# Patient Record
Sex: Female | Born: 2000 | Race: Black or African American | Hispanic: No | Marital: Single | Smoking: Never smoker
Health system: Southern US, Community
[De-identification: ages and names within clinical notes are randomized; demographics above are authoritative.]

---
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
## 2020-09-28 ENCOUNTER — Ambulatory Visit (INDEPENDENT_AMBULATORY_CARE_PROVIDER_SITE_OTHER): Payer: 59

## 2020-09-28 VITALS — BP 127/75 | HR 87 | Ht 61.5 in | Wt 118.6 lb

## 2020-09-28 DIAGNOSIS — Z3201 Encounter for pregnancy test, result positive: Secondary | ICD-10-CM | POA: Diagnosis not present

## 2020-09-28 LAB — POCT PREGNANCY, URINE: Preg Test, Ur: POSITIVE — AB

## 2020-09-28 MED ORDER — PREPLUS 27-1 MG PO TABS: 1.0000 | ORAL_TABLET | Freq: Every day | ORAL | 8 refills | Status: AC

## 2020-09-28 NOTE — Progress Notes (Signed)
LMP 07/01/20- unsure of dates EDD: 04/07/21 12w 5d  Pt here today for UPT, +UPT at home and in office today. Pt denies any vaginal bleeding, clots, pain/cramps.  Pt advised to have dating ultrasound due to unsure of dates due to irregular periods.   Pt has [place] for dating scheduled for 10/05/20 at 1115 am at [facility].   Dr Crissie Reese aware of new OB pt.   Judeth Cornfield, RN  09/28/20.

## 2020-09-28 NOTE — Patient Instructions (Addendum)
Prenatal Care Providers           [facility]  [address] [phone]  [facility]   [address]  [phone]  [facility] @ [place]       [address] [phone]            [facility] @ [place]     [address] [phone]          [facility] [place]   [address] [phone]  [facility]  [address] [phone]     [facility] ([place])  [address]   [phone]     [facility]  Phone: [phone]   

## 2020-09-28 NOTE — Progress Notes (Signed)
Chart reviewed for nurse visit. Agree with plan of care.   Zaxton Angerer M, MD 09/28/20 12:13 PM 

## 2020-10-04 ENCOUNTER — Telehealth (INDEPENDENT_AMBULATORY_CARE_PROVIDER_SITE_OTHER): Payer: 59

## 2020-10-04 DIAGNOSIS — O219 Vomiting of pregnancy, unspecified: Secondary | ICD-10-CM

## 2020-10-04 DIAGNOSIS — Z3400 Encounter for supervision of normal first pregnancy, unspecified trimester: Secondary | ICD-10-CM | POA: Insufficient documentation

## 2020-10-04 DIAGNOSIS — Z3A00 Weeks of gestation of pregnancy not specified: Secondary | ICD-10-CM

## 2020-10-04 MED ORDER — PROMETHAZINE HCL 25 MG PO TABS: 25.0000 mg | ORAL_TABLET | Freq: Four times a day (QID) | ORAL | 0 refills | Status: AC | PRN

## 2020-10-04 NOTE — Progress Notes (Addendum)
New OB Intake  I connected with  Donna Coffey on 10/04/20 at 10:15 AM EDT by telepone and verified that I am speaking with the correct person using two identifiers. Nurse is located at [facility] and pt is located at home. Approx. 38 minutes were spent with patient via telephone.  I discussed the limitations, risks, security and privacy concerns of performing an evaluation and management service by telephone and the availability of in person appointments. I also discussed with the patient that there may be a patient responsible charge related to this service. The patient expressed understanding and agreed to proceed.  I explained I am completing New OB Intake today. We discussed her EDD of 04/07/21 that is based on LMP of 07/01/20. LMP is accurate within days; pt will have dating [place] tomorrow to confirm. Pt is G1/P0. I reviewed her allergies, medications, Medical/Surgical/OB history, and appropriate screenings. I informed her of [facility] services. Based on history, this is an uncomplicated pregnancy.  Patient Active Problem List   Diagnosis Date Noted  . Supervision of low-risk first pregnancy 10/04/2020   Concerns addressed today Pt complains of ongoing nausea with one episode of vomiting. Phenergan prescribed per protocol.  Delivery Plans Plans to deliver at [facility].   MyChart/Babyscripts MyChart access verified. I explained pt will have some visits in office and some virtually. Babyscripts instructions given and order placed. Patient verifies receipt of registration text/e-mail. Account successfully created and app downloaded.  Blood Pressure Cuff Patient has private insurance; instructed to purchase blood pressure cuff and bring to first prenatal appt. Explained after first prenatal appt pt will check weekly and document in Babyscripts.  Anatomy US Explained first scheduled [place] will be around 19 weeks. Order placed. Pt to be scheduled at new OB appt once dating is confirmed by [place].    Labs Discussed Avelina Laine genetic screening with patient. Would like both Panorama and Horizon drawn at new OB visit. Routine prenatal labs needed.  Covid Vaccine Patient has not covid vaccine.   Social Determinants of Health . Food Insecurity: Patient denies food insecurity. . WIC Referral: Patient is considering. . Transportation: Patient denies transportation needs. . Childcare: Discussed no children allowed at ultrasound appointments. Offered childcare services; patient declines childcare services at this time.  First visit review I reviewed new OB appt with pt. I explained she will have a visit with provider that includes a physical exam and ob bloodwork with genetic screening. Explained pt will be seen by Nolene Bernheim, NP at first visit; encounter routed to appropriate provider. Explained that patient will be seen by pregnancy navigator following visit with provider.  Marjo Bicker, RN 10/04/2020  11:01 AM    Chart reviewed for nurse visit. Agree with plan of care.   Currie Paris, NP 10/04/2020 3:20 PM

## 2020-10-05 ENCOUNTER — Ambulatory Visit (HOSPITAL_COMMUNITY)
Admission: RE | Admit: 2020-10-05 | Discharge: 2020-10-05 | Disposition: A | Discharge status: Home / Self Care | Payer: 59 | Source: Ambulatory Visit | Attending: Obstetrics and Gynecology | Admitting: Obstetrics and Gynecology

## 2020-10-05 ENCOUNTER — Other Ambulatory Visit: Payer: Self-pay

## 2020-10-05 DIAGNOSIS — Z3201 Encounter for pregnancy test, result positive: Secondary | ICD-10-CM | POA: Diagnosis present

## 2020-10-12 ENCOUNTER — Ambulatory Visit (INDEPENDENT_AMBULATORY_CARE_PROVIDER_SITE_OTHER): Payer: 59 | Admitting: Nurse Practitioner

## 2020-10-12 ENCOUNTER — Other Ambulatory Visit: Payer: Self-pay

## 2020-10-12 ENCOUNTER — Other Ambulatory Visit (HOSPITAL_COMMUNITY)
Admission: RE | Admit: 2020-10-12 | Discharge: 2020-10-12 | Disposition: A | Discharge status: Home / Self Care | Payer: 59 | Source: Ambulatory Visit | Attending: Nurse Practitioner | Admitting: Nurse Practitioner

## 2020-10-12 ENCOUNTER — Encounter: Payer: Self-pay | Admitting: Nurse Practitioner

## 2020-10-12 VITALS — BP 125/71 | HR 99 | Wt 125.0 lb

## 2020-10-12 DIAGNOSIS — Z3401 Encounter for supervision of normal first pregnancy, first trimester: Secondary | ICD-10-CM

## 2020-10-12 NOTE — Patient Instructions (Signed)
[email or web address]   Safe Medications in Pregnancy   Acne: Benzoyl Peroxide Salicylic Acid  Backache/Headache: Tylenol: 2 regular strength every 4 hours OR              2 Extra strength every 6 hours  Colds/Coughs/Allergies: Benadryl (alcohol free) 25 mg every 6 hours as needed Breath right strips Claritin Cepacol throat lozenges Chloraseptic throat spray Cold-Eeze- up to three times per day Cough drops, alcohol free Flonase (by prescription only) Guaifenesin Mucinex Robitussin DM (plain only, alcohol free) Saline nasal spray/drops Sudafed (pseudoephedrine) & Actifed ** use only after [redacted] weeks gestation and if you do not have high blood pressure Tylenol Vicks Vaporub Zinc lozenges Zyrtec   Constipation: Colace Ducolax suppositories Fleet enema Glycerin suppositories Metamucil Milk of magnesia Miralax Senokot Smooth move tea  Diarrhea: Kaopectate Imodium A-D  *NO pepto Bismol  Hemorrhoids: Anusol Anusol HC Preparation H Tucks  Indigestion: Tums Maalox Mylanta Zantac  Pepcid  Insomnia: Benadryl (alcohol free) 25mg  every 6 hours as needed Tylenol PM Unisom, no Gelcaps  Leg Cramps: Tums MagGel  Nausea/Vomiting:  Bonine Dramamine Emetrol Ginger extract Sea bands Meclizine  Nausea medication to take during pregnancy:  Unisom (doxylamine succinate 25 mg tablets) Take one tablet daily at bedtime. If symptoms are not adequately controlled, the dose can be increased to a maximum recommended dose of two tablets daily (1/2 tablet in the morning, 1/2 tablet mid-afternoon and one at bedtime). Vitamin B6 100mg  tablets. Take one tablet twice a day (up to 200 mg per day).  Skin Rashes: Aveeno products Benadryl cream or 25mg  every 6 hours as needed Calamine Lotion 1% cortisone cream  Yeast infection: Gyne-lotrimin 7 Monistat 7   **If taking multiple medications, please check labels to avoid duplicating the same active ingredients **take  medication as directed on the label ** Do not exceed 4000 mg of tylenol in 24 hours **Do not take medications that contain aspirin or ibuprofen   AREA PEDIATRIC/FAMILY PRACTICE PHYSICIANS  [place]outheast [place] ( ) . [facility] [place]; [id number], MD; [facility], MD; Melodie Bouillon, MD; McDiarmid, MD; Lum Babe, MD; Sheffield Slider, MD; Leveda Anna, MD o [address] [place], [address] [place] o [phone] o Mon-Fri 8:30-12:30, 1:30-5:00 o Providers come to see babies at [facility] o Accepting Medicaid . [facility] [place] o Limited providers who accept newborns: [phone], MD; [facility], MD; [place], MD o [address], [place], Paulino Rily [address] o [phone] o Mon-Fri 8:00-5:30 o Babies seen by providers at [facility] o Does NOT accept Medicaid o Please call early in hospitalization for appointment (limited availability)  . [facility] [phone], MD o [address], [place], Fatima Sanger [address] o [phone] o Mon, Tue, Thur, Fri 8:30-5:00, Wed 10:00-7:00 (closed 1-2pm) o Babies seen by [facility] providers o Accepting Medicaid . 12-27-1968 - Pediatrician 05-16-1998, MD o [address], [place], Fae Pippin [place] o [phone] o Mon-Fri 8:30-5:00, Sat 8:30-12:00 o Provider comes to see babies at [facility] o Accepting Medicaid o Must have been referred from current patients or contacted office prior to delivery . Tim & [phone] [facility]) [facility], MD; Kingsley Plan, MD; [facility], MD; Leotis Pain, MD; Ave Filter, MD; Luna Fuse, MD; Kennedy Bucker, MD; Konrad Dolores, MD; Kathlene November, MD; Jenne Campus, MD; Lubertha South, NP; Wynetta Emery, NP o [address] [place], [place], [address] [place] o [phone] o Mon, Tue, Thur, Fri 8:30-5:30, Wed 9:30-5:30, Sat 8:30-12:30 o Babies seen  by [facility] providers o Accepting Medicaid o Only accepting infants of  first-time parents or siblings of current patients o Hospital discharge coordinator will make follow-up appointment . Cyril MourningJack Amos o [address], [place], [place]  [address] o [phone]   Fax - [phone] . [facility] o [address], [place], [place]  [address] o Phone - [phone]   Fax - [phone] . Lucio EdwardShilpa Gosrani o [address], [place], [place]  [address] o [phone]  East/[place]heast [place] [phone]) . [facility] Jorge Mandrilo Bates, MD; Alita ChyleBrassfield, MD; Princella Ionooper, Cox, MD; MD; Earlene Plateravis, MD; Jamesetta Orleansovico, MD; Alvera NovelEttefaugh, MD; Clarene DukeLittle, MD; Rana SnareLowe, MD; Carmon GinsbergKeiffer, MD; Alinda MoneyMelvin, MD; Hosie PoissonSumner, MD; Mayford KnifeWilliams, MD o [address], [place], [place] [address] o [phone] o Mon-Fri 8:30-5:00 (extended evenings Mon-Thur as needed), Sat-Sun 10:00-1:00 o Providers come to see babies at [facility] o Accepting Medicaid for families of first-time babies and families with all children in the household age 723 and under. Must register with office prior to making appointment (M-F only). Alric Quan. [facility] Odella Aquaso Henson, NP; Lynelle DoctorKnapp, MD; Susann GivensLalonde, MD; [place], [place] o [address] [place], [place] [address] o [phone] o Mon-Fri 8:00-5:00 o Babies seen by providers at [facility] o Does NOT accept Medicaid/Commercial Insurance Only . [facility] [place] [facility])  Suzette Battiesto Artis, MD; Zachery DauerBarnes, MD; Stefan ChurchBratton, MD; Sabino Dickoccaro, MD; Quitman LivingsLockett Gardner, MD; Farris HasKramer, MD; Gaynell FaceMarshall, MD; Betha LoaNetherton, MD; Colon FlatteryPoleto, MD; Clifton JamesSkinner, MD o [address] [place], [place] [address] o [phone] o Mon-Fri 8:30-5:30, Sat (Oct.-Mar.) 9:00-1:00 o Babies seen by providers at [facility] o Accepting [facility]  [place] [phone]) . [facility] [place] [place] o Reid, MD; Sheliah HatchWarner, MD o [address], [place], [place] [address] o [phone] o Mon-Fri 8:30-5:00, Sat 8:30-12:00 o Providers come to see babies at [facility] o Does NOT accept Medicaid . [facility] Cindy Hazyo Becker, [place]; FreerHagler, MD; [place], [place]; Wynelle LinkSun, MD; Azucena CecilSwayne, MD o [address], [place], [place] [address] o [phone] o Mon-Fri 8:00-5:00 o Babies seen by providers at [facility] o Does NOT accept Medicaid o Only accepting babies of parents who are patients o Please call early in hospitalization for appointment (limited availability) . [facility] Lamar Beneso Clark, MD; Abran CantorFrye, MD; Early OsmondKelleher, MD; Cherre HugerMack, NP; Hyacinth MeekerMiller, MD; Dwan Bolt'Keller, MD; Jarold MottoPatterson, NP; Dario GuardianPudlo, MD; Talmage NapPuzio, MD; Maisie Fushomas, MD; Pricilla Holmucker, MD; Tama Highwiselton, MD o [address], [place], [place] [address] o [phone] o Mon-Fri 8:00-5:00, Sat 9:00-12:00 o Providers come to see babies at [facility] o Does NOT accept [facility]  [place] [phone]) . [facility] o Limited providers accepting new patients: Drema PryBrake, NP; [place], PA o [address], [place], [place] [address] o [phone] o Mon-Fri 8:00-5:00 o Babies seen by providers at [facility] o Does NOT accept Medicaid o Only accepting babies of parents who are patients o Please call early in hospitalization for appointment (limited availability) . [facility] Luan Pullingo Gay, MD; Nash DimmerQuinlan, MD o [address] [place], [place] [address] o [phone] (press 1 to schedule appointment) o Mon-Fri 8:00-5:00 o Providers come to see babies at [facility] o Does NOT accept Medicaid . [facility] Cristino Marteso Mazer, MD o [address] [place], [place] [address] o [phone] o Mon-Fri 8:30-5:00 (lunch 12:30-1:00), extended hours by appointment only Wed 5:00-6:30 o Babies seen by [facility] providers o Accepting Medicaid . [facility] o Banks,  MD; [place], MD; Hassan Rowan, MD o [address] [place], [place], [place] [address] o [phone] o Mon-Fri 8:00-5:00 o Babies seen by [facility] providers o Does  NOT accept Medicaid . Nature conservation officer at [place] [address] [place] Soho, MD; Durene Cal, MD; Hurst, DO o [address] [place], [place] [address] o [phone] o Mon-Fri 8:00-5:00 o Babies seen by [facility] providers o Does NOT accept Medicaid . [facility] o [place], [place]; [place], [place]; [place], NP; Avis Epley, MD; Vonna Kotyk, MD; Clance Boll, MD; Stevphen Rochester, NP; Arvilla Market, NP; Ann Maki, NP; Otis Dials, NP; Vaughan Basta, MD; [place], MD o [address] [place], [place] [address] o [phone] o Mon-Fri 8:30-5:00, Sat 10:00-1:00 o Providers come to see babies at [facility] o Does NOT accept Medicaid o Free prenatal information session Tuesdays at 4:45pm . [facility] Luna Kitchens, MD; [place], [place]; [place], [place]; [place], [place] o [address] [place] [address] o [phone] o Mon-Fri 7:30-5:30 o Babies seen by [facility] providers . [facility] o [address], [place], [place]  [address] o [phone]   Fax - [phone]  [place] [phone] & [phone]) . [facility] Alphonsa Overall, MD o [address] [place], [place] [address] o [phone] o Mon-Thur 8:00-6:00 o Providers come to see babies at [facility] o Accepting Medicaid . [facility] Zenon Mayo, NP; Cyndia Bent, MD; [place], [place]; [place], [place] o [address] [place], [place] [address] o [phone] o Mon-Thur 7:30-7:30, Fri 7:30-4:30 o Babies seen by [facility] providers o Accepting Medicaid . [facility] Cheryle Horsfall, MD; Janene Harvey, NP; Vonita Moss, MD o [address], [place], [place] [address] o [phone] o Mon-Fri 8:30-5:00, Sat 8:30-12:00 o Providers come to see babies at [facility] o Accepting Medicaid o Must have "Meet & Greet" appointment at office prior to delivery . [facility] [place] [facility] Llana Aliment, MD; Earlene Plater, MD; Lucretia Roers, MD o [address], [place], [place] [address] o [phone] o Mon-Wed 8:00-6:00, Thur-Fri 8:00-5:00, Sat 9:00-12:00 o Providers come to see babies at [facility] o Does NOT accept Medicaid o Only accepting siblings of current patients . [facility]  o [address], [place], [place]  [address] o [phone]   Fax - [phone] . [facility] at [facility] o [phone] [address], [place], [place]  [address] o [phone]   Fax - [phone]  [place] [phone] & [phone]) . Nature conservation officer at [facility] o [place], DO; [place], DO o [address] [place], [place] [address] o [phone] o Mon-Fri 7:00-5:00 o Babies seen by [facility] providers o Does NOT accept Medicaid . [facility] Ellis Savage, MD; [place], [place]; [place], [place] o [address], [place], [place] [address] o [phone] o Mon-Fri 8:00-5:00 o Babies seen by [facility] providers o Accepting Medicaid . [facility] [place] [facility] - [address] Franne Forts, MD; [place], [place]; [place], NP; [place], [place] o [address] [place], [place], [place] [address] o [phone] o Mon-Fri 8:00-5:00 o Babies seen by providers at [facility] o Accepting [facility] [place] [phone]) . [facility] [place], [place] o [address] [place], [place] [address] o [phone] o Mon-Fri 8:00-5:00 o Babies seen by [facility] providers o Does NOT accept Medicaid o Limited availability, please call early in hospitalization to  schedule follow-up . [facility] Jolee Ewing, PA; Eddie Candle, MD; [place] Cavanaugh, MD; [place], [place]; Constance Goltz, MD; [place], [place] o [facility] [address], [place], [place] [address] o [phone] o Mon-Fri 8:30-5:00, Sat 9:00-12:00 o Babies seen by providers at [facility] o Accepting Medicaid o Please register online  then schedule online or call office o [email or web address] . [facility]) Samuella Bruin, NP; Lucianne Muss, MD; Lanier Clam, PA o [address], [place], [place] [address] o [phone] o Mon-Fri 8:00-5:00 o Babies seen by providers at [facility] o Accepting Medicaid . [facility]) Sharin Mons, MD; Reed Breech, NP; Shelva Majestic, MD o [address], [place], [place] [address] o [phone] o Mon-Fri 8:00-5:30, Sat&Sun by appointment (phones open at 8:30) o Babies seen by [facility] providers o Accepting Medicaid o Must be a first-time baby or sibling of current patient . [facility] [address], [place], [place]  [address] o [phone]   Fax - [phone]  [place] [phone] & [phone]) . [facility] o [place], [place]; [place], [place]; Dimple Casey, MD; [place], [place]; Carolyne Fiscal, MD o [address], [place], [place] [address] o [phone] o Mon-Thur 8:00-7:00, Fri 8:00-5:00, Sat 8:00-12:00, Sun 9:00-12:00 o Babies seen by [facility] providers o Accepting Medicaid . [facility] at [facility], MD; Gaynell Face, MD; [facility], MD o [address], [place], [place] [address] o [phone] o Mon-Thur 8:00-5:00 o Babies seen by providers at [facility] o Accepting Medicaid . [facility] at [place] Gwenlyn Saran, MD; Coe[place]Goins, MD; Madilyn Fireman, MD; Melvyn Neth, MD; List, MD; Lazarus Salines, MD; Gaynell Face, MD; Berneda Rose, MD; Flora Lipps, MD; Beryl Meager, MD; Luther Redo, MD; Lavonia Drafts, MD; Kellie Simmering, MD o [address] [place], [place] [address] o [phone] o Mon-Fri 8:00-5:30, Sat (Oct.-Mar.) 9:00-1:00 o Babies seen by providers at [facility] o Accepting Medicaid o Must fill out new patient packet, available online at  [email or web address] . [facility] at [facility]) Simone Curia, NP; Tiburcio Pea, NP; Tresa Endo, NP; Whitney Post, MD; [place], [place]; Hennie Duos, MD; Wynne Dust, MD; Kavin Leech, NP o [address], [place], [place] [address] o [phone] o Mon-Thur 8:00-5:30, Fri 8:00-5:00 o Babies seen by providers at [facility] o Accepting [facility] [phone]) . [facility] o [place], [place]; [place], MD; Tanya Nones, MD; [place], [place] o [address] [place], [place] [address] o [phone] o Mon-Fri 8:00-5:00 o Babies seen by providers at [facility] o Accepting [facility] [phone]) . [facility] at [facility] o [place], DO; Lenise Arena, MD; [place], [place] o [address], [place], [place] [address] o [phone] o Mon-Fri 8:00-5:00 o Babies seen by providers at [facility] o Does NOT accept Medicaid o Limited appointment availability, please call early in hospitalization  . Nature conservation officer at [facility] o [place], DO; [place], MD o [address] [place], [place] [address] o [phone] o Mon-Fri 8:00-5:00 o Babies seen by [facility] providers o Does NOT accept Medicaid . [facility] Lorrine Kin, MD; Ninetta Lights, MD; [place], [place]; [place], MD o [address], [place], [place] [address] o [phone] o Mon-Fri 8:00-5:00 o After hours clinic ([address] [place], [place] [address]) [phone] Mon-Fri 5:00-8:00, Sat 12:00-6:00, Sun 10:00-4:00 o Babies seen by [facility] providers o Accepting Medicaid . [facility] at [facility] o [address], [place], [place]  [address] o [phone]   Fax - [phone]  [place] [phone]) . Nature conservation officer at [facility], MD o [address] [place], [place], [place] [address] o [phone] o Mon-Fri 8:00-5:00 o Babies seen by [facility] providers o Does NOT accept  Medicaid . [facility] - [place] [facility] at [place]) Tomi Likens, MD o [address] [place] [address] [place], [place] [address] o [phone] o Mon-Thur 8:00-7:00, Fri 8:00-5:00, Sat 8:00-12:00 o Babies seen by providers at [facility] o Accepting Medicaid - but does not have vaccinations in office (must be received elsewhere) o Limited availability, please call early in hospitalization  [place] ([phone]) . [facility]  o Wyvonne Lenz, MD o [address] [place] [address] o [phone]  Fax [phone]

## 2020-10-12 NOTE — Progress Notes (Signed)
Subjective:   Donna Coffey is a 20 y.o. G1P0000 at [redacted]w[redacted]d by early ultrasound being seen today for her first obstetrical visit.  Her obstetrical history is significant for first pregnancy with some nausea. Patient does intend to breast feed. Pregnancy history fully reviewed.  Patient reports no complaints.  HISTORY: OB History  Gravida Para Term Preterm AB Living  1 0 0 0 0 0  SAB IAB Ectopic Multiple Live Births  0 0 0 0 0    # Outcome Date GA Lbr Len/2nd Weight Sex Delivery Anes PTL Lv  1 Current            History reviewed. No pertinent past medical history. History reviewed. No pertinent surgical history. Family History  Problem Relation Age of Onset  . Cancer Maternal Grandmother    Social History   Tobacco Use  . Smoking status: Never Smoker  . Smokeless tobacco: Never Used  Vaping Use  . Vaping Use: Never used  Substance Use Topics  . Alcohol use: Not Currently    Comment: special occasions  . Drug use: Never   No Known Allergies Current Outpatient Medications on File Prior to Visit  Medication Sig Dispense Refill  . Prenatal Vit-Fe Fumarate-FA (PREPLUS) 27-1 MG TABS Take 1 tablet by mouth daily. 30 tablet 8  . promethazine (PHENERGAN) 25 MG tablet Take 1 tablet (25 mg total) by mouth every 6 (six) hours as needed for nausea or vomiting. 30 tablet 0   No current facility-administered medications on file prior to visit.     Exam   Vitals:   10/12/20 1543  BP: 125/71  Pulse: 99  Weight: 125 lb (56.7 kg)   Fetal Heart Rate (bpm): 172  Uterus:     Pelvic Exam: Perineum: Pelvic deferred - pap not due   Vulva:    Vagina:     Cervix:    Adnexa:    Bony Pelvis:   System: General: well-developed, well-nourished female in no acute distress   Breast:  normal appearance, no masses or tenderness   Skin: normal coloration and turgor, no rashes   Neurologic: oriented, normal, negative, normal mood   Extremities: normal strength, tone, and muscle mass, ROM  of all joints is normal   HEENT extraocular movement intact and sclera clear, anicteric   Mouth/Teeth deferred   Neck supple and no masses, normal thyroid   Cardiovascular: regular rate and rhythm   Respiratory:  no respiratory distress, normal breath sounds   Abdomen: soft, non-tender; no masses,  no organomegaly     Assessment:   Pregnancy: G1P0000 Patient Active Problem List   Diagnosis Date Noted  . Supervision of low-risk first pregnancy 10/04/2020     Plan:  1. Encounter for supervision of low-risk first pregnancy in first trimester Doing well.  No vomiting.  Does not want medication for nausea as it is resolving Will get genetic testing next visit. Unable to hear FHT with doppler today.  Handheld [place] by Dr. Crissie Reese. Thought her sister had a BP cuff so she did not purchase one, but then her sister could not find the BP cuff.  Recommended she purchase her own.  - CBC/D/Plt+RPR+Rh+ABO+RubIgG... - Culture, OB Urine - Hemoglobin A1c - GC/Chlamydia probe amp ([place])not at [facility]   Initial labs drawn. Continue prenatal vitamins. Genetic Screening discussed, NIPS: discussed and will draw at next visit due to change in [facility] based on early [place]. Ultrasound discussed; fetal anatomic survey: will order at next visit. Problem  list reviewed and updated. The nature of [place] [facility] with multiple MDs and other Advanced Practice Providers was explained to patient; also emphasized that residents, students are part of our team. Routine obstetric precautions reviewed. Return in about 4 weeks (around 11/09/2020) for in person ROB.  Total face-to-face time with patient: 40 minutes.  Over 50% of encounter was spent on counseling and coordination of care.     Nolene Bernheim, FNP Family Nurse Practitioner, [facility] for [employer], [facility] 10/12/2020 5:35 PM

## 2020-10-13 LAB — GC/CHLAMYDIA PROBE AMP (~~LOC~~) NOT AT ARMC
Chlamydia: NEGATIVE
Comment: NEGATIVE
Comment: NORMAL
Neisseria Gonorrhea: NEGATIVE

## 2020-10-13 LAB — CBC/D/PLT+RPR+RH+ABO+RUBIGG...
Antibody Screen: NEGATIVE
Basophils Absolute: 0 10*3/uL (ref 0.0–0.2)
Basos: 0 %
EOS (ABSOLUTE): 0.1 10*3/uL (ref 0.0–0.4)
Eos: 1 %
HCV Ab: <0.1 s/co ratio (ref 0.0–0.9)
HIV Screen 4th Generation wRfx: NONREACTIVE
Hematocrit: 37.4 % (ref 34.0–46.6)
Hemoglobin: 12 g/dL (ref 11.1–15.9)
Hepatitis B Surface Ag: NEGATIVE
Immature Grans (Abs): 0 10*3/uL (ref 0.0–0.1)
Immature Granulocytes: 0 %
Lymphocytes Absolute: 2.2 10*3/uL (ref 0.7–3.1)
Lymphs: 21 %
MCH: 26.1 pg — ABNORMAL LOW (ref 26.6–33.0)
MCHC: 32.1 g/dL (ref 31.5–35.7)
MCV: 81 fL (ref 79–97)
Monocytes Absolute: 0.9 10*3/uL (ref 0.1–0.9)
Monocytes: 9 %
Neutrophils Absolute: 7.4 10*3/uL — ABNORMAL HIGH (ref 1.4–7.0)
Neutrophils: 69 %
Platelets: 213 10*3/uL (ref 150–450)
RBC: 4.6 x10E6/uL (ref 3.77–5.28)
RDW: 14.6 % (ref 11.7–15.4)
RPR Ser Ql: NONREACTIVE
Rh Factor: POSITIVE
Rubella Antibodies, IGG: 3.64 index (ref >0.99)
WBC: 10.6 10*3/uL (ref 3.4–10.8)

## 2020-10-13 LAB — HCV INTERPRETATION

## 2020-10-13 LAB — HEMOGLOBIN A1C
Est. average glucose Bld gHb Est-mCnc: 114 mg/dL
Hgb A1c MFr Bld: 5.6 % (ref 4.8–5.6)

## 2020-10-14 LAB — URINE CULTURE, OB REFLEX

## 2020-10-14 LAB — CULTURE, OB URINE

## 2020-11-12 ENCOUNTER — Encounter: Payer: 59 | Admitting: Medical

## 2020-12-09 ENCOUNTER — Encounter: Payer: 59 | Admitting: Obstetrics and Gynecology

## 2020-12-27 ENCOUNTER — Encounter: Payer: Self-pay | Admitting: Family Medicine

## 2020-12-27 ENCOUNTER — Ambulatory Visit (INDEPENDENT_AMBULATORY_CARE_PROVIDER_SITE_OTHER): Payer: 59 | Admitting: Family Medicine

## 2020-12-27 ENCOUNTER — Other Ambulatory Visit: Payer: Self-pay

## 2020-12-27 VITALS — BP 119/73 | HR 85 | Wt 130.3 lb

## 2020-12-27 DIAGNOSIS — Z3A20 20 weeks gestation of pregnancy: Secondary | ICD-10-CM

## 2020-12-27 DIAGNOSIS — N939 Abnormal uterine and vaginal bleeding, unspecified: Secondary | ICD-10-CM

## 2020-12-27 DIAGNOSIS — Z3400 Encounter for supervision of normal first pregnancy, unspecified trimester: Secondary | ICD-10-CM

## 2020-12-27 NOTE — Progress Notes (Addendum)
Subjective:  Donna Coffey is a 20 y.o. G1P0000 at [redacted]w[redacted]d being seen today for prenatal care. Last seen for initial prenatal visit. Overall, doing well.  Patient reports mild vaginal spotting occasionally after intercourse. Describes as "one or two drops." No associated vaginal discharge, abdominal pain, vaginal irritation, or itching.  Contractions: Not present. Movement: Present. Denies leaking of fluid.   The following portions of the patient's history were reviewed and updated as appropriate: allergies, current medications, past family history, past medical history, past social history, past surgical history and problem list.   Objective:   Vitals:   12/27/20 1547  BP: 119/73  Pulse: 85  Weight: 130 lb 4.8 oz (59.1 kg)    Fetal Status: Fetal Heart Rate (bpm): 150 Fundal Height: 21 cm Movement: Present     General:  Alert, oriented and cooperative. Patient is in no acute distress.  Skin: Skin is warm and dry. No rash noted.   Cardiovascular: Normal heart rate noted  Respiratory: Normal respiratory effort, no problems with respiration noted  Abdomen: Soft, gravid, appropriate for gestational age. Pain/Pressure: Absent     Vaginal: Vag. Bleeding: None.       Cervix: Not evaluated  Declined pelvic exam.       Extremities: Normal range of motion.  Edema: None  Mental Status: Normal mood and affect. Normal behavior. Normal judgment and thought content.    Assessment and Plan:  Pregnancy: G1P0000 at [redacted]w[redacted]d doing well   1. Encounter for supervision of low-risk first pregnancy, antepartum Declined Horizon Screening  Anatomy US scheduled for 8/24 Continue taking prenatal vitamin  - Genetic Screening (panorama screening)  - AFP, Serum, Open Spina Bifida  2. [redacted] weeks gestation of pregnancy  3. Vaginal spotting Post-coital only and occasionally, 1-2 drops per patient report, likely friable cervix. However, patient declined STI/wet prep swabs and pelvic exam today for further evaluation  despite recommendation. +FHT and normal fetal movement with anatomy U/S in the next few weeks.   She was given precautions to go to the MAU/return if the bleeding increases, LOF occurs, or if severe abdominal pain or contractions occur.   Preterm labor symptoms and general obstetric precautions including but not limited to vaginal bleeding, contractions, leaking of fluid and fetal movement were reviewed in detail with the patient.  Please refer to After Visit Summary for other counseling recommendations.  Return in about 4 weeks (around 01/24/2021) for LROB.   Athena Masse, Student-PA   GME ATTESTATION:  I saw and evaluated the patient. I agree with the findings and the plan of care as documented in the Student's note.  Leticia Penna, DO OB Fellow, [facility], [facility] 12/27/2020 4:55 PM

## 2020-12-27 NOTE — Patient Instructions (Signed)
Recommend that you be evaluated if you have any worsening of your vaginal bleeding.   Additionally, if you have any leakage of fluid or persistent contractions please go to the hospital:   [facility]  [address]  [place], [place]

## 2020-12-29 LAB — AFP, SERUM, OPEN SPINA BIFIDA
AFP MoM: 1.4
AFP Value: 107.9 ng/mL
Gest. Age on Collection Date: 20.5 weeks
Maternal Age At EDD: 20.8 yr
OSBR Risk 1 IN: 7201
Test Results:: NEGATIVE
Weight: 130 [lb_av]

## 2021-01-12 ENCOUNTER — Encounter: Payer: Self-pay | Admitting: General Practice

## 2021-01-19 ENCOUNTER — Ambulatory Visit: Payer: 59 | Attending: Nurse Practitioner

## 2021-01-19 ENCOUNTER — Other Ambulatory Visit: Payer: Self-pay

## 2021-01-19 ENCOUNTER — Encounter: Payer: Self-pay | Admitting: Nurse Practitioner

## 2021-01-19 ENCOUNTER — Other Ambulatory Visit: Payer: Self-pay | Admitting: Nurse Practitioner

## 2021-01-19 DIAGNOSIS — O43199 Other malformation of placenta, unspecified trimester: Secondary | ICD-10-CM | POA: Insufficient documentation

## 2021-01-19 DIAGNOSIS — Z3400 Encounter for supervision of normal first pregnancy, unspecified trimester: Secondary | ICD-10-CM | POA: Diagnosis not present

## 2021-01-21 ENCOUNTER — Other Ambulatory Visit: Payer: Self-pay | Admitting: *Deleted

## 2021-01-21 DIAGNOSIS — O43199 Other malformation of placenta, unspecified trimester: Secondary | ICD-10-CM

## 2021-01-21 DIAGNOSIS — O283 Abnormal ultrasonic finding on antenatal screening of mother: Secondary | ICD-10-CM

## 2021-01-27 ENCOUNTER — Encounter: Payer: 59 | Admitting: Obstetrics and Gynecology

## 2021-02-16 ENCOUNTER — Ambulatory Visit: Payer: 59

## 2021-03-02 ENCOUNTER — Ambulatory Visit: Payer: 59

## 2021-03-02 ENCOUNTER — Ambulatory Visit: Payer: 59 | Attending: Obstetrics

## 2021-05-13 ENCOUNTER — Inpatient Hospital Stay (HOSPITAL_COMMUNITY): Admission: RE | Admit: 2021-05-13 | Payer: 59 | Source: Home / Self Care | Admitting: Obstetrics

## 2023-02-10 IMAGING — US US OB < 14 WEEKS - US OB TV
1 series · 14 of 28 positions shown · non-contrast
Comparison: None.

CLINICAL DATA: Dating uncertain LMP

EXAM:
OBSTETRIC <14 WK US AND TRANSVAGINAL OB US
TECHNIQUE: Both transabdominal and transvaginal ultrasound examinations were
performed for complete evaluation of the gestation as well as the
maternal uterus, adnexal regions, and pelvic cul-de-sac.
Transvaginal technique was performed to assess early pregnancy.

[Series 1: us ob less than 14 weeks with ob transvaginal · 14 of 107 slices shown]
[im 4/107]
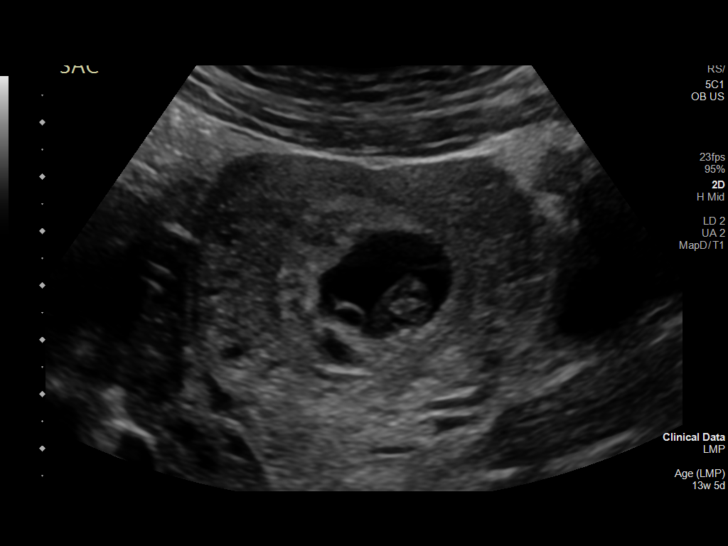
[im 12/107]
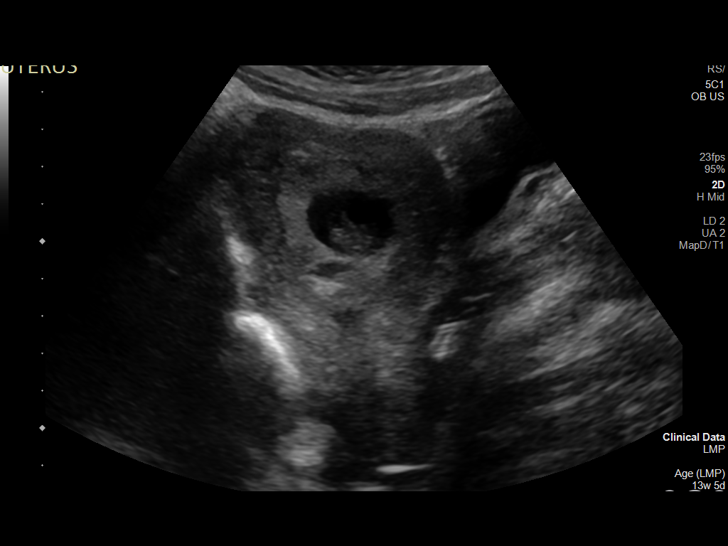
[im 20/107]
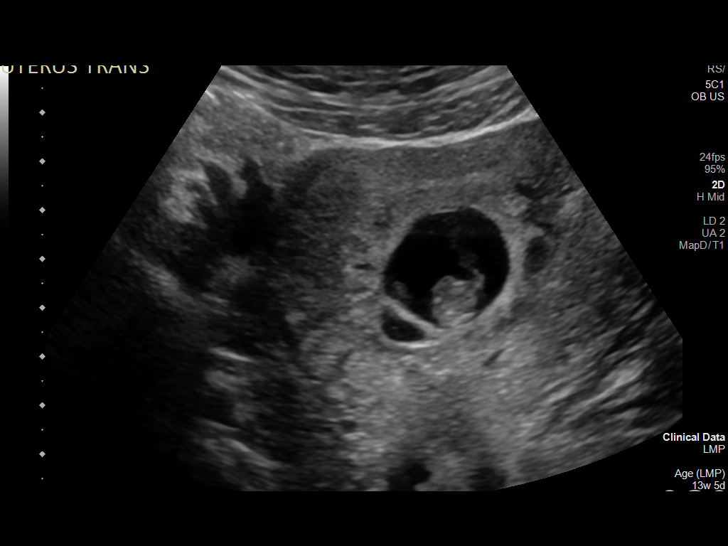
[im 28/107]
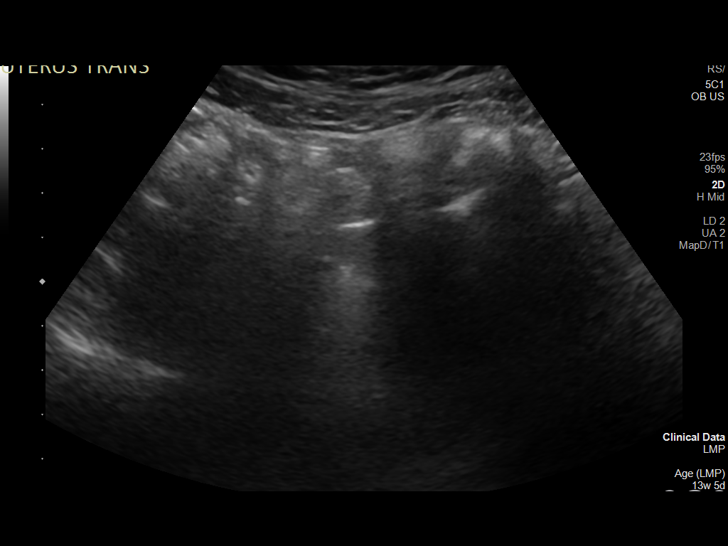
[im 36/107]
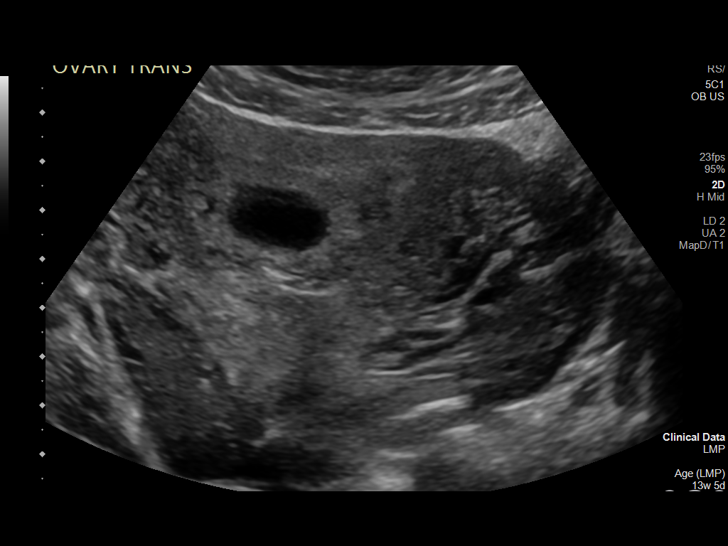
[im 44/107]
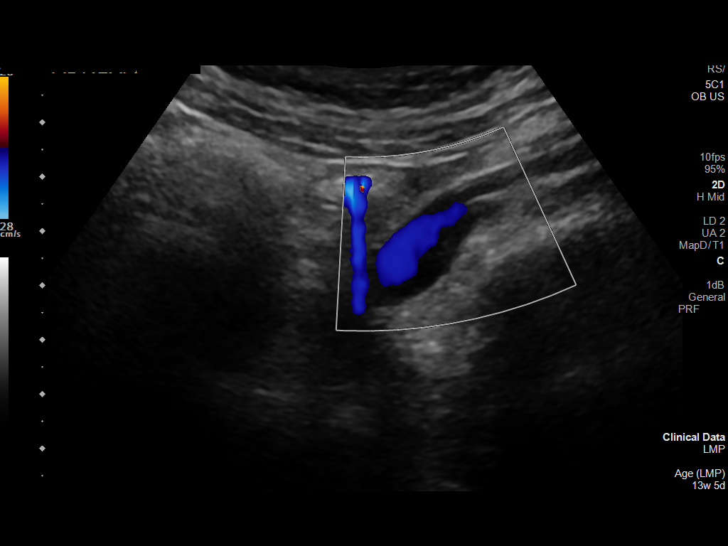
[im 52/107]
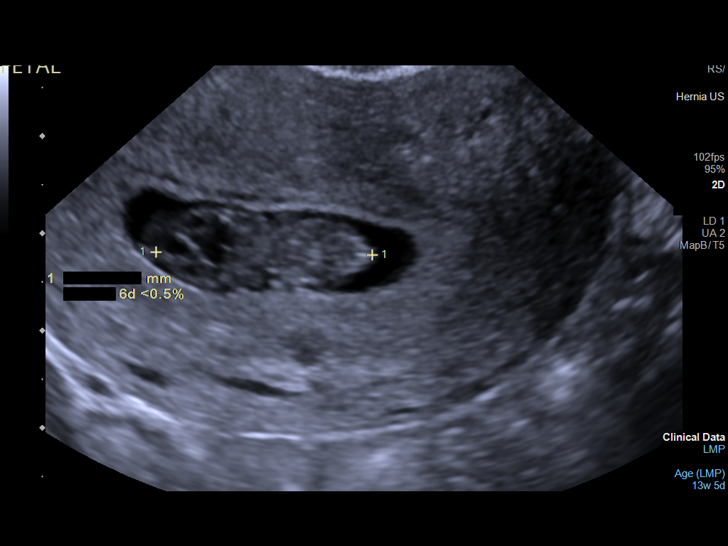
[im 59/107]
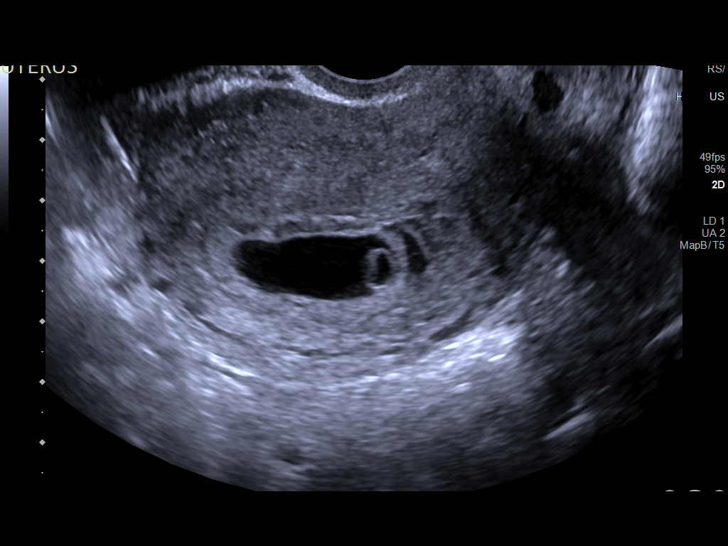
[im 67/107]
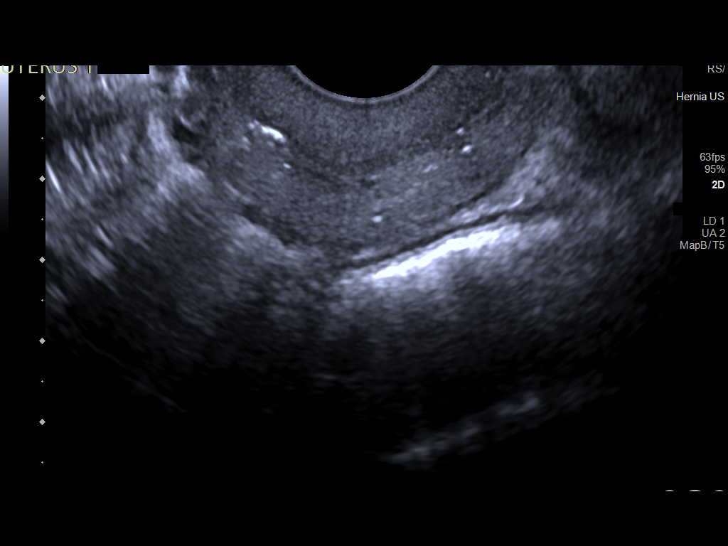
[im 75/107]
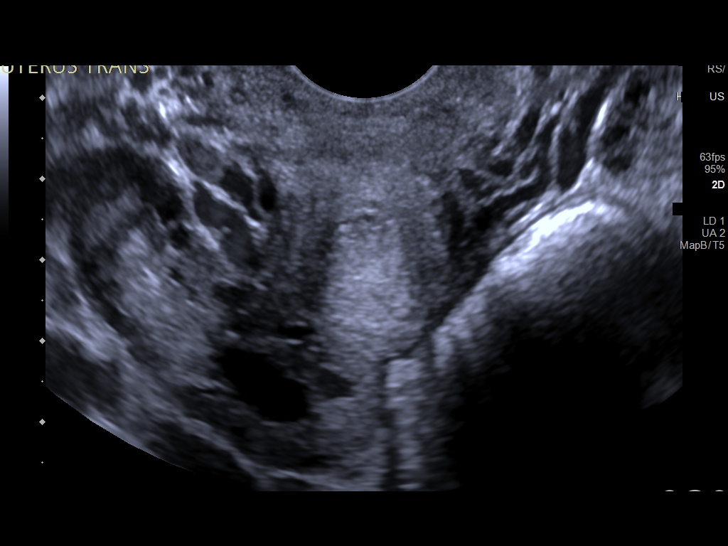
[im 83/107]
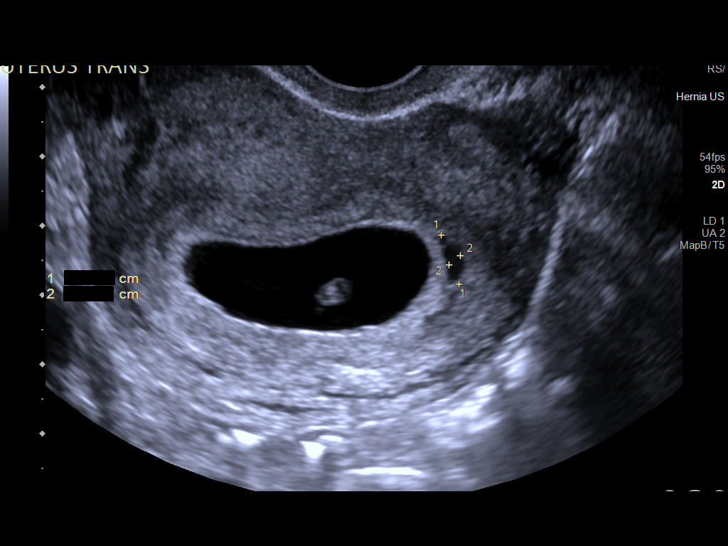
[im 91/107]
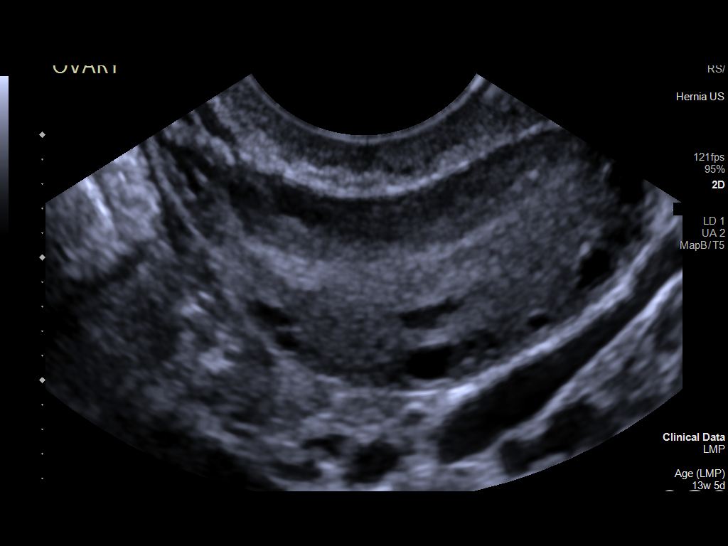
[im 99/107]
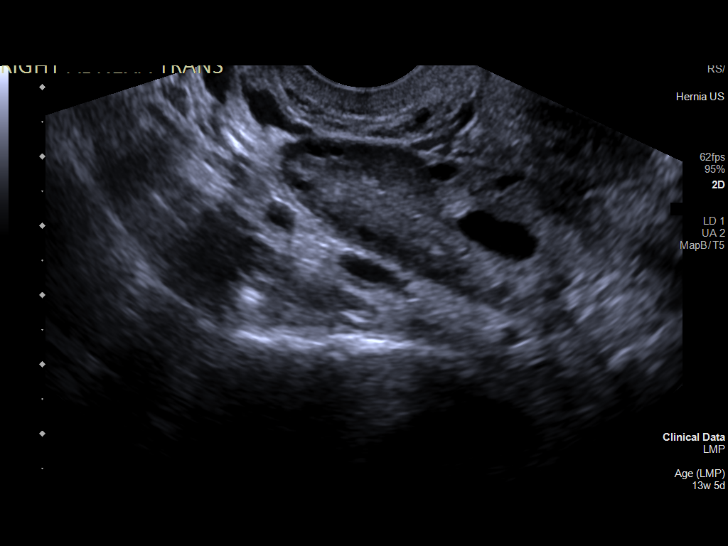
[im 107/107]
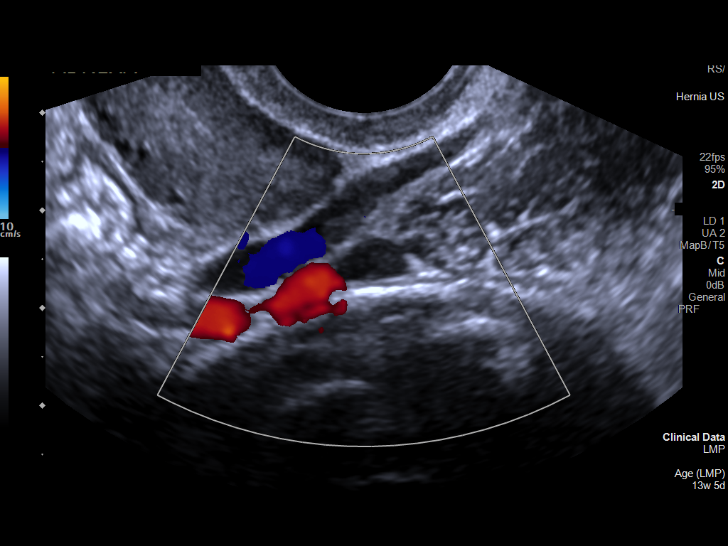

[14 of 28 positions shown; findings below may reference images not displayed]

FINDINGS: Intrauterine gestational sac: Single

Yolk sac:  Visualized

Embryo:  Visualized

Cardiac Activity: Visualized

Heart Rate: 144 bpm

CRL: 22.2 mm   8 w   6 d                  US EDC: 05/11/2021

Subchorionic hemorrhage: Small subchorionic hemorrhage on the left
side of the sac.

Maternal uterus/adnexae: Ovaries are within normal limits. The right
ovary measures 2.2 x 4.6 x 1.7 cm. The left ovary measures 3.9 x
by 3.2 cm. No significant free fluid
IMPRESSION: 1. Single viable intrauterine pregnancy with estimated sonographic
age of 8 weeks 6 days and ultrasound EDC of 05/11/2021.
2. Small subchorionic hemorrhage

## 2024-04-23 ENCOUNTER — Other Ambulatory Visit: Payer: Self-pay

## 2024-04-23 NOTE — Patient Instructions (Signed)
 All Outreaches & Follow-ups   Scheduled     Episode Task Planned Due Responsible   [facility] Supportive Services Food [employer] Pickup 05/15/2024 05/15/2024 [employer] Pool   2:00 PM      Last 30 days     Episode Task Date Resolved Resolved By Outcome   [facility] Supportive Services Food Pant[employer] Pickup 04/23/2024 Soila Chew (on behalf of [employer]) Completed Successfully   11:39 AM

## 2024-04-23 NOTE — Progress Notes (Signed)
 Total number of people in household: 2Services provided today: Full food distribution and Food pharmacy box[]  TEFAP form completed

## 8052-10-27 DEATH — deceased
